# Patient Record
Sex: Female | Born: 1985 | Race: White | Hispanic: No
Health system: Southern US, Community
[De-identification: ages and names within clinical notes are randomized; demographics above are authoritative.]

---

## 2006-06-27 ENCOUNTER — Ambulatory Visit: Payer: Self-pay | Admitting: Otolaryngology

## 2006-06-29 ENCOUNTER — Emergency Department: Payer: Self-pay | Admitting: Emergency Medicine

## 2009-06-11 ENCOUNTER — Ambulatory Visit: Payer: Self-pay | Admitting: Family Medicine

## 2010-10-24 ENCOUNTER — Ambulatory Visit: Payer: Self-pay | Admitting: Surgery

## 2010-10-26 LAB — PATHOLOGY REPORT

## 2013-06-06 IMAGING — CT CT ABD-PELV W/ CM
1 of 2 series · 15 of 32 positions shown, 19 images · IV contrast (isovue)
Comparison: None

REASON FOR EXAM: (1) RLQ/LLQ pain and vomiting; (2) RLQ/LLQ pain and
vomiting
COMMENTS:

PROCEDURE:     CT  - CT ABDOMEN / PELVIS  W  - October 24, 2010  [DATE]
RESULT:     History: Right lower quadrant pain
TECHNIQUE: Multiple axial images of the abdomen and pelvis were performed
from the lung bases to the pubic symphysis, with p.o. contrast and with 100
ml of Isovue 370 intravenous contrast.

[Series 2: appendicitis · axial · 0.67mm/px · z∈[-716,-287]mm · 15 of 155 slices shown, 19 images]
[im 6/155  soft-tissue]
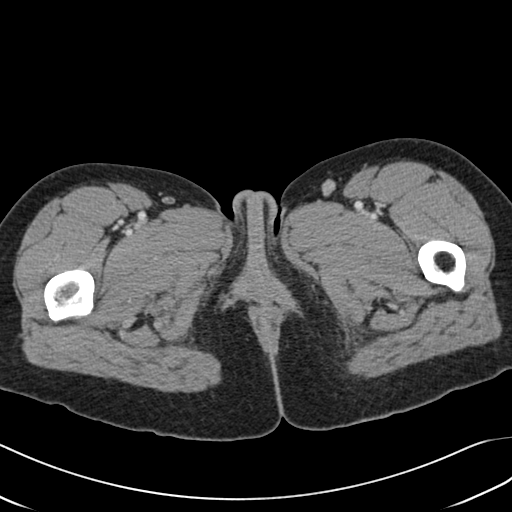
[im 6/155  bone]
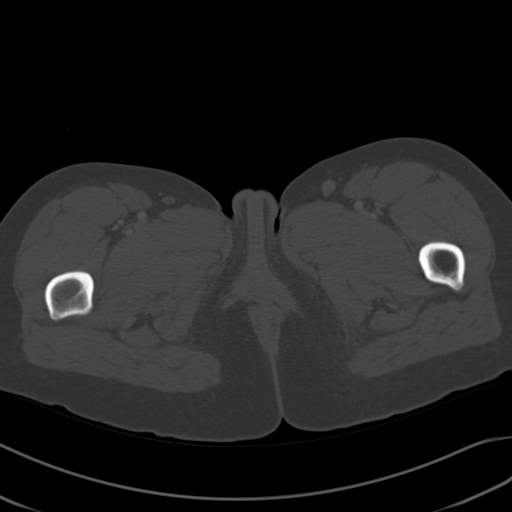
[im 18/155  soft-tissue]
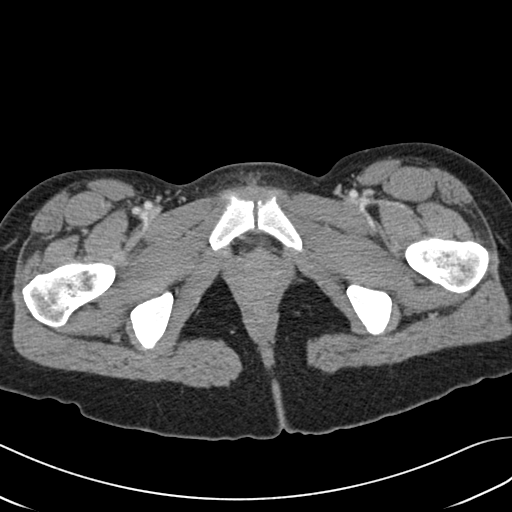
[im 30/155  soft-tissue]
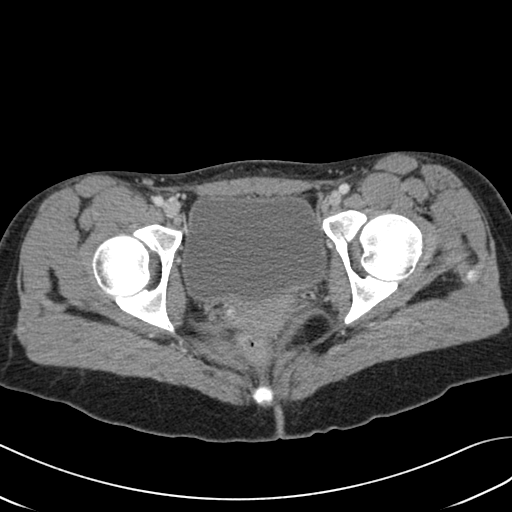
[im 42/155  soft-tissue]
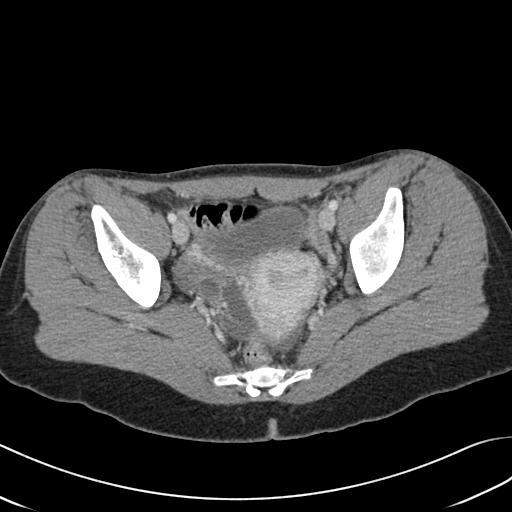
[im 54/155  soft-tissue]
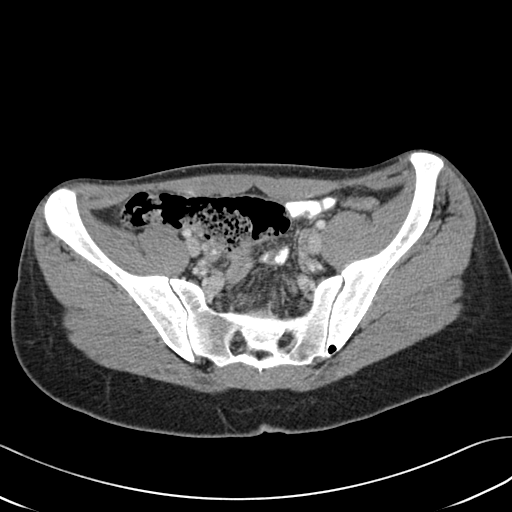
[im 66/155  soft-tissue]
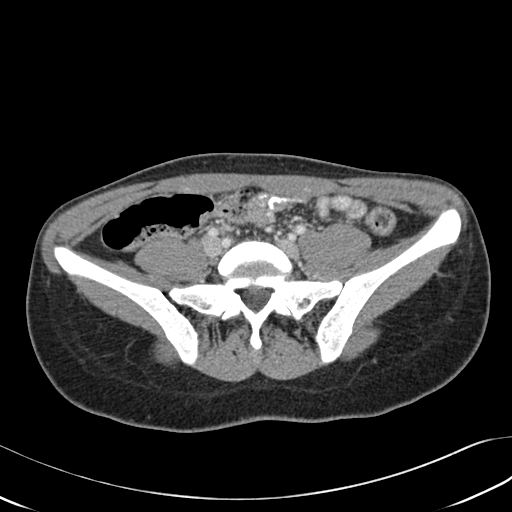
[im 78/155  soft-tissue]
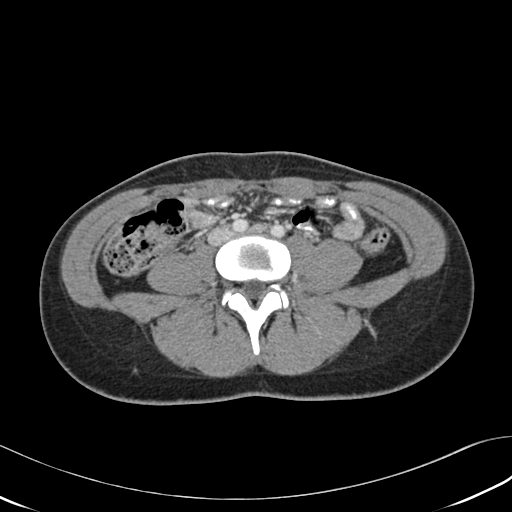
[im 89/155  soft-tissue]
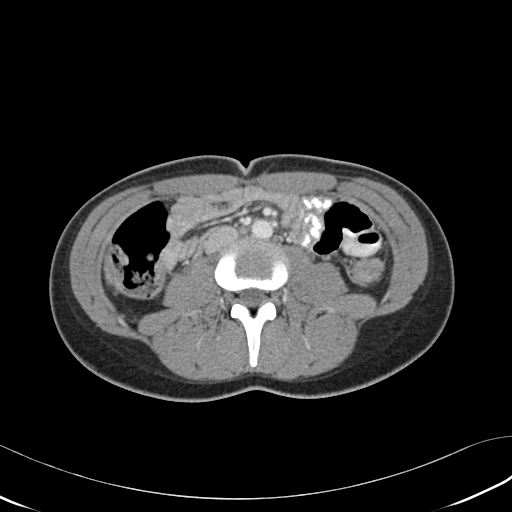
[im 101/155  soft-tissue]
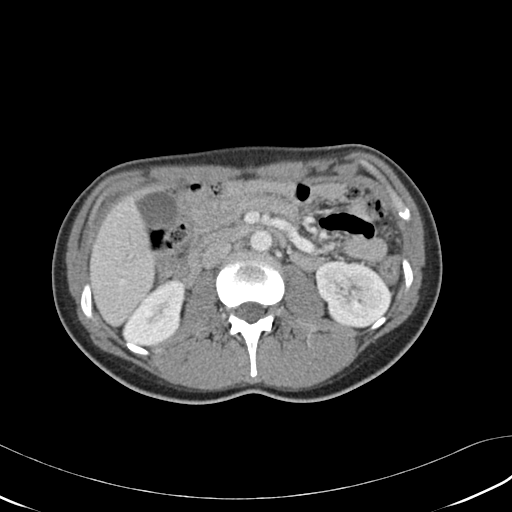
[im 101/155  bone]
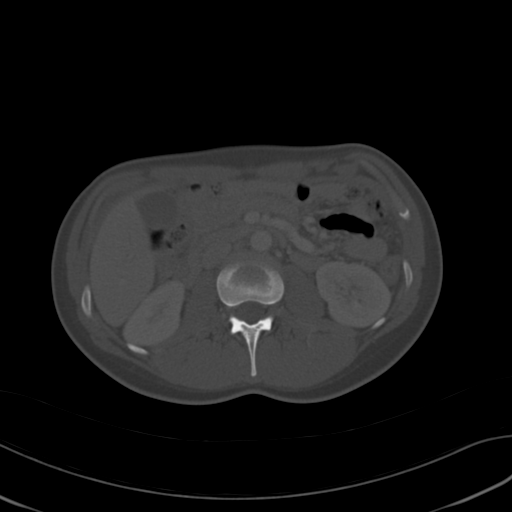
[im 113/155  soft-tissue]
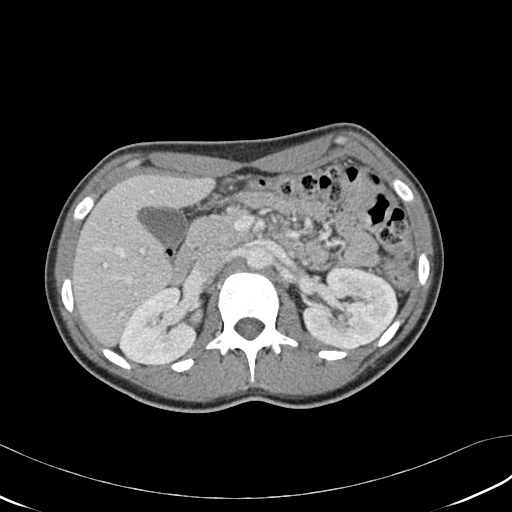
[im 125/155  soft-tissue]
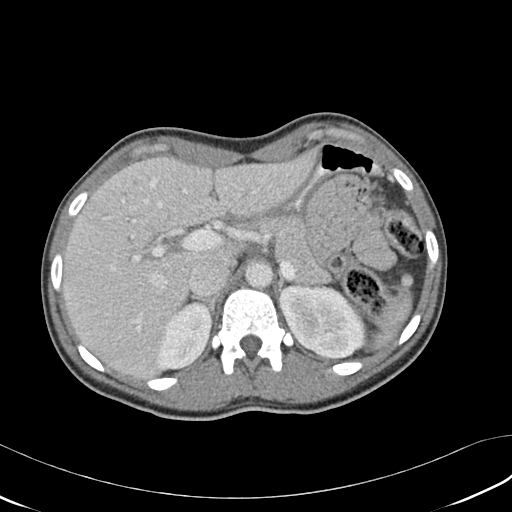
[im 131/155  lung]
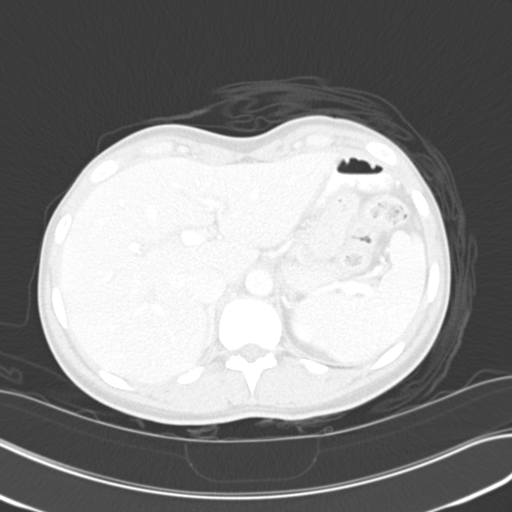
[im 137/155  soft-tissue]
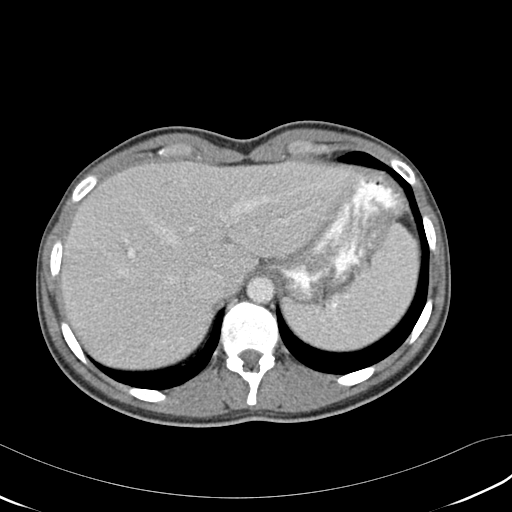
[im 137/155  lung]
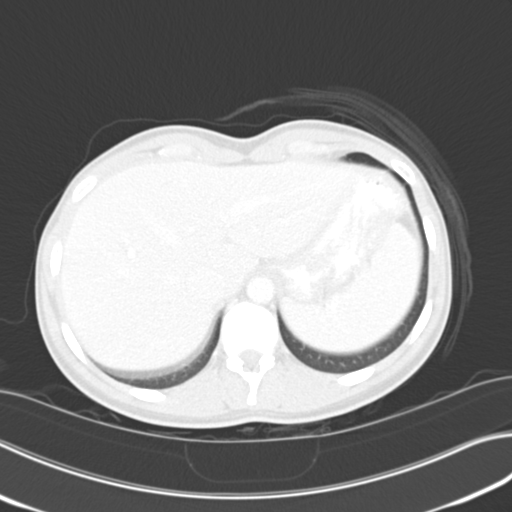
[im 143/155  lung]
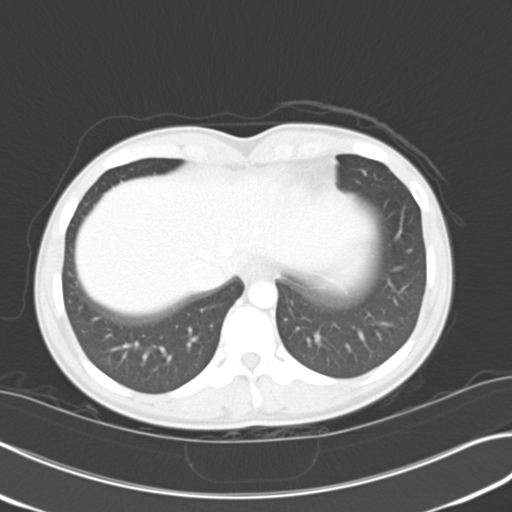
[im 149/155  soft-tissue]
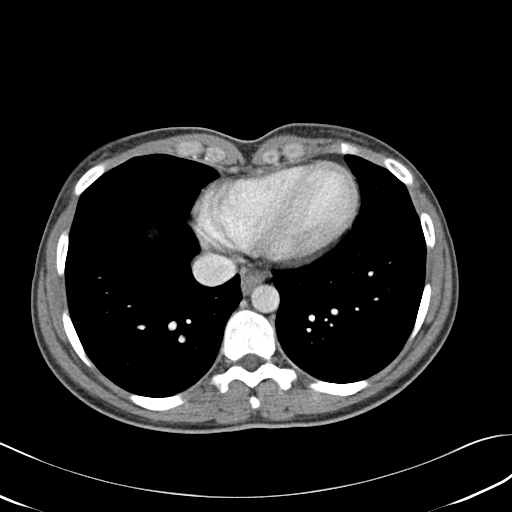
[im 149/155  lung]
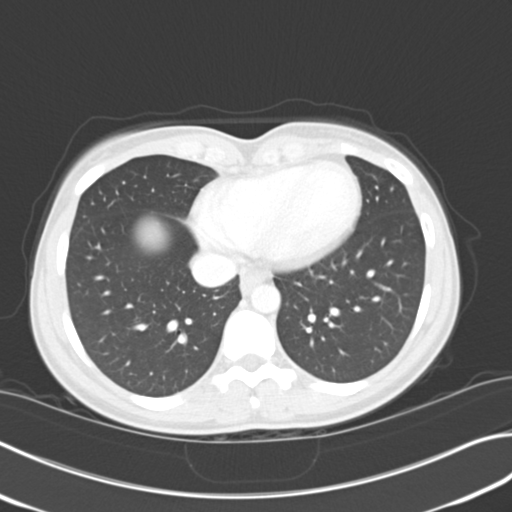

[15 of 32 positions shown; findings below may reference images not displayed]

FINDINGS: The lung bases are clear. There is no pneumothorax. The heart size is
normal.

The liver demonstrates no focal abnormality. There is no intrahepatic or
extrahepatic biliary ductal dilatation. The gallbladder is unremarkable. The
spleen demonstrates no focal abnormality. There is a small nonobstructing
right renal calculus. The left kidney, adrenal glands, and pancreas are
normal. The bladder is unremarkable.

The stomach, duodenum, small intestine, and large intestine demonstrate no
contrast extravasation or dilatation. The appendix is dilated measuring
mm with an appendicolith at the appendiceal base and fluid filling the
remainder of the appendiceal lumen. There is a small amount of right lower
quadrant pelvic free fluid. There is no pneumoperitoneum, pneumatosis, or
portal venous gas. There is no abdominal or pelvic free fluid. There is no
lymphadenopathy.

The abdominal aorta is normal in caliber .

The osseous structures are unremarkable.
IMPRESSION: Dilated appendix with an appendicolith and fluid filling the appendiceal
lumen most consistent with acute appendicitis.

Right nephrolithiasis.

These findings were communicated to Dr. Bridget on 10/24/2010 at 8930 hours.

## 2014-02-02 ENCOUNTER — Inpatient Hospital Stay: Payer: Self-pay | Admitting: Internal Medicine

## 2014-02-02 LAB — LIPASE, BLOOD: LIPASE: 105 U/L (ref 73–393)

## 2014-02-02 LAB — URINALYSIS, COMPLETE
Bilirubin,UR: NEGATIVE
GLUCOSE, UR: NEGATIVE mg/dL (ref 0–75)
LEUKOCYTE ESTERASE: NEGATIVE
NITRITE: NEGATIVE
Ph: 6 (ref 4.5–8.0)
Protein: NEGATIVE
SPECIFIC GRAVITY: 1.009 (ref 1.003–1.030)

## 2014-02-02 LAB — CBC
HCT: 37.9 % (ref 35.0–47.0)
HGB: 12.4 g/dL (ref 12.0–16.0)
MCH: 30.7 pg (ref 26.0–34.0)
MCHC: 32.7 g/dL (ref 32.0–36.0)
MCV: 94 fL (ref 80–100)
Platelet: 318 10*3/uL (ref 150–440)
RBC: 4.05 10*6/uL (ref 3.80–5.20)
RDW: 13.4 % (ref 11.5–14.5)
WBC: 12.1 10*3/uL — AB (ref 3.6–11.0)

## 2014-02-02 LAB — COMPREHENSIVE METABOLIC PANEL
ALK PHOS: 50 U/L
Albumin: 4.4 g/dL (ref 3.4–5.0)
Anion Gap: 7 (ref 7–16)
BUN: 8 mg/dL (ref 7–18)
Bilirubin,Total: 1 mg/dL (ref 0.2–1.0)
CALCIUM: 9 mg/dL (ref 8.5–10.1)
CREATININE: 0.84 mg/dL (ref 0.60–1.30)
Chloride: 105 mmol/L (ref 98–107)
Co2: 23 mmol/L (ref 21–32)
EGFR (African American): >60
EGFR (Non-African Amer.): >60
Glucose: 90 mg/dL (ref 65–99)
Osmolality: 268 (ref 275–301)
Potassium: 3.8 mmol/L (ref 3.5–5.1)
SGOT(AST): 17 U/L (ref 15–37)
SGPT (ALT): 23 U/L
SODIUM: 135 mmol/L — AB (ref 136–145)
Total Protein: 8.1 g/dL (ref 6.4–8.2)

## 2014-02-02 LAB — HCG, QUANTITATIVE, PREGNANCY: Beta Hcg, Quant.: 315 m[IU]/mL — ABNORMAL HIGH

## 2014-02-03 LAB — BASIC METABOLIC PANEL WITH GFR
Anion Gap: 10
BUN: 8 mg/dL
Calcium, Total: 8.2 mg/dL — ABNORMAL LOW
Chloride: 105 mmol/L
Co2: 23 mmol/L
Creatinine: 0.9 mg/dL
EGFR (African American): >60
EGFR (Non-African Amer.): >60
Glucose: 92 mg/dL
Osmolality: 274
Potassium: 3.6 mmol/L
Sodium: 138 mmol/L

## 2014-02-03 LAB — CBC WITH DIFFERENTIAL/PLATELET
BASOS PCT: 0.2 %
Basophil #: 0 10*3/uL (ref 0.0–0.1)
EOS PCT: 0.1 %
Eosinophil #: 0 10*3/uL (ref 0.0–0.7)
HCT: 30.8 % — ABNORMAL LOW (ref 35.0–47.0)
HGB: 10.2 g/dL — AB (ref 12.0–16.0)
Lymphocyte #: 1.4 10*3/uL (ref 1.0–3.6)
Lymphocyte %: 10.7 %
MCH: 30.8 pg (ref 26.0–34.0)
MCHC: 33 g/dL (ref 32.0–36.0)
MCV: 93 fL (ref 80–100)
MONO ABS: 0.8 x10 3/mm (ref 0.2–0.9)
MONOS PCT: 5.8 %
NEUTROS PCT: 83.2 %
Neutrophil #: 11 10*3/uL — ABNORMAL HIGH (ref 1.4–6.5)
PLATELETS: 233 10*3/uL (ref 150–440)
RBC: 3.3 10*6/uL — ABNORMAL LOW (ref 3.80–5.20)
RDW: 13 % (ref 11.5–14.5)
WBC: 13.2 10*3/uL — AB (ref 3.6–11.0)

## 2014-02-04 LAB — URINE CULTURE

## 2014-05-10 NOTE — H&P (Signed)
PATIENT NAME:  Christina Price, Brett MR#:  [id number] DATE OF BIRTH:  Jan 28, 1985  DATE OF ADMISSION:  02/02/2014  ADMITTING PHYSICIAN: Enid Baasadhika Alejandro Gamel, MD   PRIMARY CARE PHYSICIAN: None.   CHIEF COMPLAINT: Right flank pain and nausea and vomiting.   HISTORY OF PRESENT ILLNESS: Ms. Christina Price is a 29 year old young Caucasian female with no significant past medical history, who presents to the hospital secondary to abrupt onset of right flank pain radiating down to her right groin region that started today. It has been associated with severe nausea, vomiting, unable to keep anything down. She never had any history of kidney stones. She was noted to be pregnant while in the hospital with a positive pregnancy test, last menstrual period being about 4 weeks ago. So, CT of the abdomen was not done and an ultrasound revealed mild right-sided hydronephrosis, nephrolithiasis. She is being admitted for acute renal colic, possible nephrolithiasis and hydronephrosis.   PAST MEDICAL HISTORY: Migraine headaches.   PAST SURGICAL HISTORY: Tonsillectomy and appendectomy.   ALLERGIES TO MEDICATIONS: INTOLERANT TO CODEINE, CAN CAUSE SWELLING.   MEDICATIONS AT HOME: Currently none.   SOCIAL HISTORY: Lives at home with her husband. Last menstrual 01/01/2015. Occasional wine intake. No smoking.   FAMILY HISTORY: History of heart disease runs in the family.    REVIEW OF SYSTEMS:  CONSTITUTIONAL: No fever fatigue or weakness.  EYES: No blurred vision, double vision, inflammation or glaucoma.  EAR, NOSE AND THROAT: No tinnitus, ear pain, hearing loss, epistaxis or discharge.  RESPIRATORY: No cough, wheeze, hemoptysis or chronic obstructive pulmonary disease.  CARDIOVASCULAR: No chest pain, orthopnea, edema, arrhythmia, palpitations, or syncope.  GASTROINTESTINAL: Positive for nausea and vomiting. No diarrhea. Positive for abdominal pain, flank pain. No melena, jaundice or active bleed.  GENITOURINARY: No dysuria, hematuria,  renal calculus, frequency, or incontinence.  ENDOCRINE: No polyuria, nocturia, thyroid problems, heat or cold intolerance.  HEMATOLOGY: No anemia, easy bruising or bleeding.  SKIN: No acne, rash or lesions.  MUSCULOSKELETAL: Right flank pain. No arthritis or gout.  NEUROLOGICAL: No numbness, weakness, CVA, transient ischemic attack or seizures.  PSYCHOLOGICAL: No anxiety, insomnia, or depression.   PHYSICAL EXAMINATION:  VITAL SIGNS: Temperature 98.1 degrees Fahrenheit, pulse 112, respirations 20, blood pressure 152/82, pulse oximetry 100% on room air.  GENERAL: Well-built, well-nourished female, lying in bed in mild distress secondary to do constant dry heaving.  HEENT: Normocephalic, atraumatic. Pupils equal, round, reacting to light. Anicteric sclerae. Extraocular movements intact. Oropharynx clear without erythema, mass or exudates.  NECK: Supple. No thyromegaly, JVD or carotid bruits. No lymphadenopathy.  LUNGS: Moving air bilaterally. No wheeze or crackles.  No use of accessory muscles for breathing.  CARDIOVASCULAR: S1, S2, regular rate and rhythm. No murmurs, rubs, or gallops.  ABDOMEN: Soft. Mild discomfort in the right lower quadrant into the groin region and also right CVA tenderness present. No guarding tenderness, or rigidity. Normal bowel sounds.  EXTREMITIES: No pedal edema. No clubbing or cyanosis, 2+ dorsalis pedis pulses palpable bilaterally.  SKIN: No acne, rash or lesions.  LYMPHATICS: No cervical or inguinal lymphadenopathy.  NEUROLOGIC: Cranial nerves intact. No focal motor or sensory deficit.  PSYCHIATRIC: The patient is awake, alert, oriented x3.   LABORATORY DATA: WBC 12.1, hemoglobin 12.4, hematocrit 37.9, platelet count 318,000.  Sodium 135, potassium 3.8, chloride 105, bicarbonate 23, BUN 8, creatinine 0.84, glucose 19, calcium of 9.0.   ALT 20, AST 17, alkaline phosphatase 50, total bilirubin 1.4, albumin of  4.4, lipase 105, beta hCG is elevated at 315.  Urinalysis with 2+ blood. No evidence of infection. Urine pregnancy test positive.   Transvaginal ultrasound showing no adnexal masses. No hemoperitoneum or pelvic free fluid. No intrauterine gestational sac. Could be atopic pregnancy, normal or abnormal, early intrauterine pregnancy.   Ultrasound of the kidneys bilaterally showing mild right-sided hydronephrosis. Distal obstruction due to stone cannot be ruled out. Possible right nephrolithiasis.   ASSESSMENT AND PLAN: This is a 29 year old with no significant past medical history came in with right flank pain, nausea, vomiting, noted to be pregnant and also right nephrolithiasis with mild right hydronephrosis.   1. Acute right renal colic with mild hydronephrosis in a pregnant the patient, ectopic pregnancy ruled out based on pelvic ultrasound no intrauterine gestational sac seen because it could be very early pregnancy. Pain medications Dilaudid and Tylenol p.r.n. Cannot do CT with radiation risk. No evidence of infection, so hold off on antibiotics unless has fevers. Urology has been consulted. Try conservative management with IV fluids. Flomax suggested. pain medication. Hopefully, stone might pass. If symptoms get worse, might need ureteroscopy versus nephrostomy.  2. Nausea and vomiting secondary to above. Zofran p.r.n. for nausea. It is category B. If it does not work, than can also use probably, Reglan, which is also category B in pregnancy. Phenergan will be category C, so will try to avoid it.  3. Early pregnancy, last menstrual period 4 weeks ago. Outpatient followup. Careful with the medications.   CODE STATUS: Full code.   TIME SPENT ON ADMISSION: 50 minutes.   ____________________________ Enid Baas, MD rk:ap D: 02/02/2014 21:08:13 ET T: 02/02/2014 21:24:59 ET JOB#: [id number]  cc: Enid Baas, MD, <Dictator> Enid Baas MD ELECTRONICALLY SIGNED 02/07/2014 13:30

## 2014-05-10 NOTE — Consult Note (Signed)
Chief Complaint:  Subjective/Chief Complaint Persistent pain overnight but significantly improved. No fevers. Tolerating clears.   VITAL SIGNS/ANCILLARY NOTES: **Vital Signs.:   26-Jan-16 05:05  Vital Signs Type Q 8hr  Temperature Temperature (F) 98.5  Celsius 36.9  Temperature Source oral  Pulse Pulse 91  Respirations Respirations 18  Systolic BP Systolic BP 98  Diastolic BP (mmHg) Diastolic BP (mmHg) 64  Mean BP 75  Pulse Ox % Pulse Ox % 97  Pulse Ox Activity Level  At rest  Oxygen Delivery Room Air/ 21 %  *Intake and Output.:   Daily 26-Jan-16 07:00  Grand Totals Intake:   Output:  500    Net:  -500 24 Hr.:  -500  Urine ml     Out:  500  Length of Stay Totals Intake:   Output:  500    Net:  -500   Brief Assessment:  GEN well developed, well nourished, no acute distress   Cardiac Regular  no murmur  --Rub  --Gallop   Respiratory normal resp effort  clear BS   Gastrointestinal Normal   Gastrointestinal details normal Soft  Nontender  Nondistended   EXTR negative cyanosis/clubbing, negative edema   Additional Physical Exam no cvat   Lab Results: Routine Chem:  26-Jan-16 05:58   Glucose, Serum 92  BUN 8  Creatinine (comp) 0.90  Sodium, Serum 138  Potassium, Serum 3.6  Chloride, Serum 105  CO2, Serum 23  Calcium (Total), Serum  8.2  Anion Gap 10  Osmolality (calc) 274  eGFR (African American) >60  eGFR (Non-African American) >60 (eGFR values <32m/min/1.73 m2 may be an indication of chronic kidney disease (CKD). Calculated eGFR, using the MRDR Study equation, is useful in  patients with stable renal function. The eGFR calculation will not be reliable in acutely ill patients when serum creatinine is changing rapidly. It is not useful in patients on dialysis. The eGFR calculation may not be applicable to patients at the low and high extremes of body sizes, pregnant women, and vegetarians.)  Routine Hem:  26-Jan-16 05:58   WBC (CBC)  13.2  RBC (CBC)   3.30  Hemoglobin (CBC)  10.2  Hematocrit (CBC)  30.8  Platelet Count (CBC) 233  MCV 93  MCH 30.8  MCHC 33.0  RDW 13.0  Neutrophil % 83.2  Lymphocyte % 10.7  Monocyte % 5.8  Eosinophil % 0.1  Basophil % 0.2  Neutrophil #  11.0  Lymphocyte # 1.4  Monocyte # 0.8  Eosinophil # 0.0  Basophil # 0.0 (Result(s) reported on 03 Feb 2014 at 06:32AM.)   Assessment/Plan:  Assessment/Plan:  Assessment 29year old woman with early pregnancy and right flank pain, found to have right hydronephrosis and absent right ureteral jet on U/S consistent with right ureteral calculus.   Plan 1) Continue trial of passage with fluid hydration, narcotic analgesia (no NSAIDs), anti-emetics 2) Flomax OK to use in pregnancy, but patient does not want to 3) If her pain and nausea cannot be controlled or she becomes septic, would proceed with placement of a percutaneous nephrostomy tube (preferred as this does not require anesthesia) or ureteral stent.  Ureteroscopy has also been shown to be safe in pregnancy but we would typically wait until the 2nd trimester. 4) She may be able to go home later today and continue trial of passage at home if preferred, with strict precautions to return to ED for fever, severe pain, inability to tolerate POs or other serious concerns. 5) Strain urine for stones  Thank you for involving me in her care. Please call Urology with any further questions   Electronic Signatures: Prentiss Bells (MD)  (Signed 26-Jan-16 06:45)  Authored: Chief Complaint, VITAL SIGNS/ANCILLARY NOTES, Brief Assessment, Lab Results, Assessment/Plan   Last Updated: 26-Jan-16 06:45 by Prentiss Bells (MD)

## 2014-05-10 NOTE — Consult Note (Signed)
Chief Complaint:  Subjective/Chief Complaint pain controlled on PO dilaudid, anxious to be discharged home. pain has moved from right flank now down to RLQ.  she has been straining her urine and has yet to pass the stone.   VITAL SIGNS/ANCILLARY NOTES: **Vital Signs.:   27-Jan-16 07:34  Vital Signs Type Q 8hr  Temperature Temperature (F) 98.4  Celsius 36.8  Temperature Source oral  Pulse Pulse 94  Respirations Respirations 20  Systolic BP Systolic BP 109  Diastolic BP (mmHg) Diastolic BP (mmHg) 69  Mean BP 82  Pulse Ox % Pulse Ox % 99  Pulse Ox Activity Level  At rest  Oxygen Delivery Room Air/ 21 %  *Intake and Output.:   Shift 27-Jan-16 15:00  Grand Totals Intake:  423 Output:  1500    Net:  -1077 24 Hr.:  -1077  IV (Primary)      In:  423  Urine ml     Out:  1500  Length of Stay Totals Intake:  4677 Output:  3750    Net:  927   Brief Assessment:  GEN well developed, well nourished, no acute distress   Cardiac Regular   Respiratory normal resp effort  no use of accessory muscles   Gastrointestinal Normal  R LLQ discomfort, no rebound or gaurding   Gastrointestinal details normal Soft  Nontender  Nondistended   EXTR negative edema   Assessment/Plan:  Assessment/Plan:  Assessment 29 year old woman with early pregnancy and right flank pain, found to have right hydronephrosis and absent right ureteral jet on U/S consistent with right ureteral calculus.   Plan 1) Continue trial of passage with fluid hydration, narcotic analgesia (no NSAIDs), anti-emetics 2) Flomax OK to use in pregnancy to expedite stone pasage, she is willing to take this medication 3) She may be able to go home later today and continue trial of passage at home if preferred, with strict precautions to return to ED for fever, severe pain, inability to tolerate POs or other serious concerns. 5) Strain urine for stones  Thank you for involving me in her care. Please call Urology with any further  questions  Please instuct patient to [facility], [phone] to arrange follow up.  I would like to her to be seen within 2 weeks of discharge for follow up.   Electronic Signatures: Claris GladdenBrandon, Cathie Bonnell J (MD)  (Signed 27-Jan-16 14:08)  Authored: Chief Complaint, VITAL SIGNS/ANCILLARY NOTES, Brief Assessment, Assessment/Plan   Last Updated: 27-Jan-16 14:08 by Claris GladdenBrandon, Tynia Wiers J (MD)

## 2014-05-10 NOTE — Consult Note (Signed)
Admit Diagnosis:   ABD PAIN AND NAUSEA: Onset Date: 02-Feb-2014, Status: Active, Description: ABD PAIN AND NAUSEA      Admit Reason:   Renal colic (X32): Status: Active, Coding System: ICD10, Coded Name: Unspecified renal colic   Hydronephrosis (N13.30): Status: Active, Coding System: ICD10, Coded Name: Unspecified hydronephrosis    Negative, patient denies medical history.:    Tonsillectomy:   Home Medications: Medication Instructions Status  Prenatal Multivitamins 1 tab(s) orally once a day (at bedtime) Active  ferrous sulfate 325 mg oral tablet 1 tab(s) orally once a day (at bedtime) Active   Lab Results: Hepatic:  25-Jan-16 13:34   Bilirubin, Total 1.0  Alkaline Phosphatase 50 (46-116 NOTE: New Reference Range 07/29/13)  SGPT (ALT) 23 (14-63 NOTE: New Reference Range 07/29/13)  SGOT (AST) 17  Total Protein, Serum 8.1  Albumin, Serum 4.4  Routine Chem:  25-Jan-16 13:34   HCG Betasubunit Quant. Serum  315 (1-3  (International Unit)  ----------------- Non-pregnant <5 Weeks Post LMP mIU/mL  3- 4 wk 9 - 130  4- 5 wk 75 - 2,600  5- 6 wk 850 - 20,800  6- 7 wk 4,000 - 100,000  7-12 wk 11,500 - 289,000 12-16 wk 18,000 - 137,000 16-29 wk 1,400 - 53,000 29-41 wk 940 - 60,000)  Glucose, Serum 90  BUN 8  Creatinine (comp) 0.84  Sodium, Serum  135  Potassium, Serum 3.8  Chloride, Serum 105  CO2, Serum 23  Calcium (Total), Serum 9.0  Osmolality (calc) 268  eGFR (African American) >60  eGFR (Non-African American) >60 (eGFR values <94m/min/1.73 m2 may be an indication of chronic kidney disease (CKD). Calculated eGFR, using the MRDR Study equation, is useful in  patients with stable renal function. The eGFR calculation will not be reliable in acutely ill patients when serum creatinine is changing rapidly. It is not useful in patients on dialysis. The eGFR calculation may not be applicable to patients at the low and high extremes of body sizes, pregnant women,  and vegetarians.)  Anion Gap 7  Lipase 105 (Result(s) reported on 02 Feb 2014 at 02:14PM.)  Routine UA:  25-Jan-16 13:34   Color (UA) Straw  Clarity (UA) Clear  Glucose (UA) Negative  Bilirubin (UA) Negative  Ketones (UA) Trace  Specific Gravity (UA) 1.009  Blood (UA) 2+  pH (UA) 6.0  Protein (UA) Negative  Nitrite (UA) Negative  Leukocyte Esterase (UA) Negative (Result(s) reported on 02 Feb 2014 at 02:13PM.)  RBC (UA) 9 /HPF  WBC (UA) 1 /HPF  Bacteria (UA) TRACE  Epithelial Cells (UA) 1 /HPF (Result(s) reported on 02 Feb 2014 at 02:13PM.)  Routine Hem:  25-Jan-16 13:34   WBC (CBC)  12.1  RBC (CBC) 4.05  Hemoglobin (CBC) 12.4  Hematocrit (CBC) 37.9  Platelet Count (CBC) 318 (Result(s) reported on 02 Feb 2014 at 01:59PM.)  MCV 94  MCH 30.7  MCHC 32.7  RDW 13.4   Radiology Results:  Radiology Results: [place]    25-Jan-16 16:35, [place] Less Than 14 Weeks w/ Transvaginal  [place] Less Than 14 Weeks w/ Transvaginal  REASON FOR EXAM:    severe right pelvic pain  COMMENTS:       PROCEDURE: [place] - [place] LESS THAN 14 WEEKS/W TRANS  - Feb 02 2014  4:35PM     CLINICAL DATA:  Severe RIGHT-sided pelvic pain. Initial encounter.  Symptoms for 1 day. Quantitative beta HCG of 315.    EXAM:  OBSTETRIC <14 WK [place] TRANSVAGINAL OB  US    TECHNIQUE:  Both transabdominal and transvaginal ultrasound examinations were  performed for complete evaluation of the gestation as well as the  maternal uterus, adnexal regions, and pelvic cul-de-sac.  Transvaginal technique was performed to assess early pregnancy.    COMPARISON:  None.    FINDINGS:  Intrauterine gestational sac: None present.    Yolk sac:  None    Embryo:  None    Maternal uterus/adnexae: LEFT ovary appears within normal limits,  measuring 38 mm x 22 mm by 29 mm. RIGHT ovary has a physiologic  appearance. No adnexal masses definitively identified.  No hemo peritoneum or pelvic free fluid is identified.      IMPRESSION:  No intrauterine gestational sac. The differential considerations  include ectopic pregnancy, normal or abnormal early intrauterine  pregnancy. Clinical and laboratory follow-up recommended.      Electronically Signed    By: Dereck Ligas M.D.    On: 02/02/2014 16:43         Verified By: Melvyn Novas, M.D.,    25-Jan-16 18:05, US Kidney Bilateral  US Kidney Bilateral  REASON FOR EXAM:    right flank pain, patient with early pregnanyc  COMMENTS:       PROCEDURE: [place]  - US KIDNEY  - Feb 02 2014  6:05PM     CLINICAL DATA:  Right flank pain for 1 day.  Early pregnancy.    EXAM:  RENAL/URINARY TRACT ULTRASOUND COMPLETE    COMPARISON:  10/24/2010 CT    FINDINGS:  Right Kidney: 10.3 cm. Echogenic focus in the interpolar region is  suspicious for a punctate calculus. 4 mm. Mild right hydronephrosis.  Left Kidney:  11.2 cm.  No hydronephrosis.    Bladder: Within normal limits. The right ureteric jet is not  visualized. Left ureteric jet is seen.     IMPRESSION:  1. Mild right-sided hydronephrosis. Although this could be  physiologic, given Early gravid state, is suspicious for distal  obstruction, likely due to stone.  2. Probable right nephrolithiasis.      Electronically Signed    By: Abigail Miyamoto M.D.    On: 02/02/2014 18:34     Verified By: Areta Haber, M.D.,    No Known Allergies:    General Aspect 29 year old woman with acute onset right flank pain since the AM, stabbing and cramping in nature, 10/10 localized to her right flank and right abdomen.  In the ED she was found to be very early in a pregnancy, BHCG in the ~300s. Her pain has been difficult to control, and she has persistent nausea and vomiting. No fevers/chills. No freq/urge, no suprapubic pain, no gross hematuria. No prior stone episodes.   Case History and Physical Exam:  Chief Complaint Abdominal Pain  Right flank pain   Past Surgical History Tonsillectomy   Primary Care  Provider [facility]   Family History Non-Contributory   HEENT PERLA   Neck/Nodes Supple   Chest/Lungs Clear   Cardiovascular No Murmurs or Gallops  Normal Sinus Rhythm   Abdomen mild right cvat, abd soft   Genitalia Not examined   Rectal Not examined   Musculoskeletal Full range of motion   Neurological Grossly WNL   Skin Warm  Dry    Impression 29 year old female, early pregnancy, with right hydronephrosis and absent right ureteral jet, with a presumed right ureteral calculus.  She is not infected, but her pain and nausea are not well controlled.  Plan 1) We discussed her options at length.  Diagnostic testing is somewhat limited due to her pregnancy status.  I would not get a CT at this point (though a low-dose CT may be reasonable if needed) or an MR Urogram as the ultrasound is enough at this point to determine that she is obstructed most likely by a stone. 2) We discussed that the first-line treatment is a trial of passage.  This includes fluid hydration, narcotic pain medication (avoid NSAIDs) and anti-emetics.   3) Flomax is Category B and determined to be safe in pregnancy for medical expulsive therapy but she wants to hold off. 4) Options, if her pain and nausea cannot be controlled or she becomes septic, would be placement of a percutaneous nephrostomy tube (preferred as this does not require anesthesia) or ureteral stent.  Ureteroscopy has also been shown to be safe in pregnancy but we would typically wait until the 2nd trimester.  Thank you for involving me in the care of Mrs. Romey. Urology will continue to follow.   Electronic Signatures: Prentiss Bells (MD)  (Signed 25-Jan-16 21:03)  Authored: Health Issues, Significant Events - History, Home Medications, Labs, Radiology Results, Allergies, General Aspect/Present Illness, History and Physical Exam, Impression/Plan   Last Updated: 25-Jan-16 21:03 by Prentiss Bells (MD)

## 2014-05-10 NOTE — Discharge Summary (Signed)
PATIENT NAME:  Christina HarrisRKS, Markeita MR#:  [id number] DATE OF BIRTH:  29-Jan-1985  DATE OF ADMISSION:  02/02/2014 DATE OF DISCHARGE:  02/04/2014  PRESENTING COMPLAINT: Abdominal right flank pain, nausea, and vomiting.   DISCHARGE DIAGNOSES: 1.  Acute right hydronephrosis with right-sided nephrolithiasis.  2.  The patient is pregnant.   CODE STATUS: Full code.   DISCHARGE MEDICATIONS:  1.  Prenatal vitamins p.o. daily.  2.  Ferrous sulfate 325 mg p.o. daily at bedtime.  3.  Hydromorphone 2 mg 3 times a day as needed.  4.  Acetaminophen 325 mg 2 tablets every 4 hours as needed.  5.  Tamsulosin 0.4 mg 1 capsule once a day.  6.  Zofran ODT 4 mg every 4 hours as needed.   FOLLOWUP: Follow up with Dr. Apolinar JunesBrandon next week, urology. The patient to get established with [facility] in 2 to 4 weeks. The patient is explained to strain her urine. Urology consultation with Dr. Arlyce DiceKaplan, Dr. Apolinar JunesBrandon.   LABORATORY DATA: White count at discharge was 13.2, hemoglobin and hematocrit is 10.2 and 30.8.   Ultrasound transvaginal shows intrauterine gestational sac none present, yolk sac none, embryo none. No acute intrauterine gestational sac. Differential includes ectopic pregnancy, normal or abnormal early intrauterine pregnancy.   Ultrasound of the abdomen: Kidney shows mild right-sided hydronephrosis, early gravid state, probable right nephrolithiasis.  UA negative for UTI.  Lipase is 105. Beta-hCG is 315. Urine culture gram-positive cocci 2000 colonies.   BRIEF SUMMARY OF HOSPITAL COURSE: Christina HarrisKelly Edds is a pleasant 29 year old Caucasian female who comes in to the Emergency Room with abdominal pain. She was found to have right-sided nephrolithiasis with hydronephrosis on conservative management by urology, received IV fluids. Her urine was strained. The patient currently has not passed any stone. Her pain and nausea were managed. No procedures as she is pregnant. The patient is agreeable to take the Flomax. She was put  on p.r.n. Dilaudid after OB/GYN was consulted. She will follow up with urology, Dr. Vanna ScotlandAshley Brandon as outpatient.  1.  Nausea and vomiting secondary to abdominal pain Zofran p.r.n. for nausea.  2.  Early pregnancy. Last menstrual period about 4 weeks ago. I spoke to OB. It is safe to give Dilaudid for a short period of time. Continue prenatal vitamin and iron. The patient will establish [facility] in another 2 to 3 weeks.   Discharge plan was discussed with the patient's husband as well.   TIME SPENT: 40 minutes.    ____________________________ Wylie HailSona A. Allena KatzPatel, MD sap:at D: 02/05/2014 06:50:07 ET T: 02/05/2014 12:28:36 ET JOB#: [id number]  cc: Davelle Anselmi A. Allena KatzPatel, MD, <Dictator> Graceann CongressAdam G. Kaplan, MD Claris GladdenAshley J. Brandon, MD Willow OraSONA A Valyncia Wiens MD ELECTRONICALLY SIGNED 02/10/2014 11:35

## 2017-01-20 MED ORDER — DIPHENHYDRAMINE HCL 25 MG PO CAPS
25.00 | ORAL_CAPSULE | ORAL | Status: DC
Start: ? — End: 2017-01-20

## 2017-01-20 MED ORDER — TUCKS 50 % EX PADS
1.00 | MEDICATED_PAD | CUTANEOUS | Status: DC
Start: ? — End: 2017-01-20

## 2017-01-20 MED ORDER — ONDANSETRON HCL 4 MG/2ML IJ SOLN
4.00 | INTRAMUSCULAR | Status: DC
Start: ? — End: 2017-01-20

## 2017-01-20 MED ORDER — DOCUSATE SODIUM 100 MG PO CAPS
100.00 | ORAL_CAPSULE | ORAL | Status: DC
Start: 2017-01-20 — End: 2017-01-20

## 2017-01-20 MED ORDER — OXYTOCIN IJ
10.00 | INTRAMUSCULAR | Status: DC
Start: ? — End: 2017-01-20

## 2017-01-20 MED ORDER — DIPHENOXYLATE-ATROPINE 2.5-0.025 MG PO TABS
2.00 | ORAL_TABLET | ORAL | Status: DC
Start: ? — End: 2017-01-20

## 2017-01-20 MED ORDER — BENZOCAINE 20 % EX AERO
1.00 | INHALATION_SPRAY | CUTANEOUS | Status: DC
Start: ? — End: 2017-01-20

## 2017-01-20 MED ORDER — ACETAMINOPHEN 325 MG PO TABS
650.00 | ORAL_TABLET | ORAL | Status: DC
Start: ? — End: 2017-01-20

## 2017-01-20 MED ORDER — IRON PO
1.00 | ORAL | Status: DC
Start: 2017-01-20 — End: 2017-01-20

## 2017-01-20 MED ORDER — IBUPROFEN 600 MG PO TABS
600.00 | ORAL_TABLET | ORAL | Status: DC
Start: ? — End: 2017-01-20

## 2017-01-20 MED ORDER — CARBOPROST TROMETHAMINE 250 MCG/ML IM SOLN
250.00 | INTRAMUSCULAR | Status: DC
Start: ? — End: 2017-01-20

## 2017-01-20 MED ORDER — MISOPROSTOL 200 MCG PO TABS
800.00 | ORAL_TABLET | ORAL | Status: DC
Start: ? — End: 2017-01-20

## 2019-10-16 ENCOUNTER — Other Ambulatory Visit: Payer: Self-pay

## 2019-10-21 ENCOUNTER — Other Ambulatory Visit: Payer: Self-pay

## 2019-10-21 DIAGNOSIS — Z20822 Contact with and (suspected) exposure to covid-19: Secondary | ICD-10-CM

## 2019-10-22 LAB — SARS-COV-2, NAA 2 DAY TAT

## 2019-10-22 LAB — NOVEL CORONAVIRUS, NAA: SARS-CoV-2, NAA: NOT DETECTED

## 2019-10-28 ENCOUNTER — Other Ambulatory Visit: Payer: BC Managed Care – PPO

## 2019-10-28 ENCOUNTER — Other Ambulatory Visit: Payer: Self-pay

## 2019-10-28 DIAGNOSIS — Z20822 Contact with and (suspected) exposure to covid-19: Secondary | ICD-10-CM

## 2019-10-29 LAB — NOVEL CORONAVIRUS, NAA: SARS-CoV-2, NAA: NOT DETECTED

## 2019-10-29 LAB — SARS-COV-2, NAA 2 DAY TAT

## 2019-11-06 ENCOUNTER — Other Ambulatory Visit: Payer: BC Managed Care – PPO

## 2019-11-06 DIAGNOSIS — Z20822 Contact with and (suspected) exposure to covid-19: Secondary | ICD-10-CM

## 2019-11-07 LAB — SARS-COV-2, NAA 2 DAY TAT

## 2019-11-07 LAB — NOVEL CORONAVIRUS, NAA: SARS-CoV-2, NAA: NOT DETECTED

## 2019-11-15 ENCOUNTER — Other Ambulatory Visit: Payer: BC Managed Care – PPO

## 2019-11-15 ENCOUNTER — Other Ambulatory Visit: Payer: Self-pay

## 2019-11-15 DIAGNOSIS — Z20822 Contact with and (suspected) exposure to covid-19: Secondary | ICD-10-CM

## 2019-11-16 LAB — SARS-COV-2, NAA 2 DAY TAT

## 2019-11-16 LAB — NOVEL CORONAVIRUS, NAA: SARS-CoV-2, NAA: NOT DETECTED

## 2019-11-22 ENCOUNTER — Other Ambulatory Visit: Payer: BC Managed Care – PPO

## 2019-11-22 ENCOUNTER — Other Ambulatory Visit: Payer: Self-pay

## 2019-11-22 DIAGNOSIS — Z20822 Contact with and (suspected) exposure to covid-19: Secondary | ICD-10-CM

## 2019-11-23 LAB — NOVEL CORONAVIRUS, NAA: SARS-CoV-2, NAA: NOT DETECTED

## 2019-11-23 LAB — SARS-COV-2, NAA 2 DAY TAT

## 2019-12-06 ENCOUNTER — Other Ambulatory Visit: Payer: BC Managed Care – PPO

## 2019-12-06 ENCOUNTER — Other Ambulatory Visit: Payer: Self-pay

## 2019-12-06 DIAGNOSIS — Z20822 Contact with and (suspected) exposure to covid-19: Secondary | ICD-10-CM

## 2019-12-07 LAB — SARS-COV-2, NAA 2 DAY TAT

## 2019-12-07 LAB — NOVEL CORONAVIRUS, NAA: SARS-CoV-2, NAA: NOT DETECTED

## 2019-12-12 ENCOUNTER — Other Ambulatory Visit: Payer: BC Managed Care – PPO

## 2019-12-12 DIAGNOSIS — Z20822 Contact with and (suspected) exposure to covid-19: Secondary | ICD-10-CM

## 2019-12-13 LAB — NOVEL CORONAVIRUS, NAA: SARS-CoV-2, NAA: NOT DETECTED

## 2019-12-13 LAB — SARS-COV-2, NAA 2 DAY TAT

## 2019-12-20 ENCOUNTER — Other Ambulatory Visit: Payer: BC Managed Care – PPO

## 2019-12-20 DIAGNOSIS — Z20822 Contact with and (suspected) exposure to covid-19: Secondary | ICD-10-CM

## 2019-12-22 LAB — SARS-COV-2, NAA 2 DAY TAT

## 2019-12-22 LAB — NOVEL CORONAVIRUS, NAA: SARS-CoV-2, NAA: NOT DETECTED

## 2019-12-24 ENCOUNTER — Other Ambulatory Visit: Payer: BC Managed Care – PPO

## 2019-12-24 DIAGNOSIS — Z20822 Contact with and (suspected) exposure to covid-19: Secondary | ICD-10-CM

## 2019-12-25 LAB — SARS-COV-2, NAA 2 DAY TAT

## 2019-12-25 LAB — NOVEL CORONAVIRUS, NAA: SARS-CoV-2, NAA: NOT DETECTED

## 2020-01-12 ENCOUNTER — Other Ambulatory Visit: Payer: BC Managed Care – PPO

## 2020-01-12 DIAGNOSIS — Z20822 Contact with and (suspected) exposure to covid-19: Secondary | ICD-10-CM

## 2020-01-14 LAB — SARS-COV-2, NAA 2 DAY TAT

## 2020-01-14 LAB — NOVEL CORONAVIRUS, NAA: SARS-CoV-2, NAA: DETECTED — AB

## 2020-01-15 ENCOUNTER — Other Ambulatory Visit: Payer: BC Managed Care – PPO

## 2020-01-17 ENCOUNTER — Other Ambulatory Visit: Payer: Self-pay

## 2020-01-17 DIAGNOSIS — Z20822 Contact with and (suspected) exposure to covid-19: Secondary | ICD-10-CM

## 2020-01-20 LAB — NOVEL CORONAVIRUS, NAA: SARS-CoV-2, NAA: DETECTED — AB

## 2020-01-24 ENCOUNTER — Other Ambulatory Visit: Payer: Self-pay

## 2020-01-24 DIAGNOSIS — Z20822 Contact with and (suspected) exposure to covid-19: Secondary | ICD-10-CM

## 2020-01-28 LAB — NOVEL CORONAVIRUS, NAA: SARS-CoV-2, NAA: NOT DETECTED

## 2020-02-02 ENCOUNTER — Other Ambulatory Visit: Payer: Self-pay

## 2020-02-02 DIAGNOSIS — Z20822 Contact with and (suspected) exposure to covid-19: Secondary | ICD-10-CM

## 2020-02-03 LAB — SARS-COV-2, NAA 2 DAY TAT

## 2020-02-03 LAB — NOVEL CORONAVIRUS, NAA: SARS-CoV-2, NAA: NOT DETECTED

## 2020-02-04 ENCOUNTER — Other Ambulatory Visit: Payer: Self-pay

## 2020-02-06 ENCOUNTER — Other Ambulatory Visit: Payer: Self-pay

## 2020-02-06 DIAGNOSIS — Z20822 Contact with and (suspected) exposure to covid-19: Secondary | ICD-10-CM

## 2020-02-07 ENCOUNTER — Other Ambulatory Visit: Payer: Self-pay

## 2020-02-08 LAB — NOVEL CORONAVIRUS, NAA: SARS-CoV-2, NAA: NOT DETECTED

## 2020-02-08 LAB — SARS-COV-2, NAA 2 DAY TAT

## 2020-02-14 ENCOUNTER — Other Ambulatory Visit: Payer: Self-pay

## 2020-02-14 DIAGNOSIS — Z20822 Contact with and (suspected) exposure to covid-19: Secondary | ICD-10-CM

## 2020-02-15 LAB — NOVEL CORONAVIRUS, NAA: SARS-CoV-2, NAA: NOT DETECTED

## 2020-02-15 LAB — SARS-COV-2, NAA 2 DAY TAT

## 2020-11-29 ENCOUNTER — Ambulatory Visit: Admit: 2020-11-29 | Discharge status: Absent without leave | Payer: Self-pay
